# Patient Record
Sex: Female | Born: 1955 | Race: White | Hispanic: No | Marital: Married | Smoking: Never smoker
Health system: Southern US, Community
[De-identification: ages and names within clinical notes are randomized; demographics above are authoritative.]

## PROBLEM LIST (undated history)

## (undated) DIAGNOSIS — M8580 Other specified disorders of bone density and structure, unspecified site: Secondary | ICD-10-CM

## (undated) DIAGNOSIS — I499 Cardiac arrhythmia, unspecified: Secondary | ICD-10-CM

## (undated) DIAGNOSIS — C44310 Basal cell carcinoma of skin of unspecified parts of face: Secondary | ICD-10-CM

## (undated) DIAGNOSIS — C4490 Unspecified malignant neoplasm of skin, unspecified: Secondary | ICD-10-CM

## (undated) HISTORY — DX: Unspecified malignant neoplasm of skin, unspecified: C44.90

## (undated) HISTORY — DX: Other specified disorders of bone density and structure, unspecified site: M85.80

## (undated) HISTORY — DX: Basal cell carcinoma of skin of unspecified parts of face: C44.310

## (undated) HISTORY — DX: Cardiac arrhythmia, unspecified: I49.9

## (undated) HISTORY — PX: APPENDECTOMY: SHX54

---

## 2010-06-28 DIAGNOSIS — Z85828 Personal history of other malignant neoplasm of skin: Secondary | ICD-10-CM | POA: Insufficient documentation

## 2015-11-23 ENCOUNTER — Other Ambulatory Visit: Payer: Self-pay | Admitting: Family Medicine

## 2015-11-23 DIAGNOSIS — E2839 Other primary ovarian failure: Secondary | ICD-10-CM

## 2015-11-23 DIAGNOSIS — Z1231 Encounter for screening mammogram for malignant neoplasm of breast: Secondary | ICD-10-CM

## 2016-01-26 ENCOUNTER — Encounter: Payer: 59 | Admitting: Obstetrics & Gynecology

## 2016-02-16 ENCOUNTER — Ambulatory Visit (INDEPENDENT_AMBULATORY_CARE_PROVIDER_SITE_OTHER): Payer: 59 | Admitting: Obstetrics & Gynecology

## 2016-02-16 ENCOUNTER — Encounter: Payer: Self-pay | Admitting: Obstetrics & Gynecology

## 2016-02-16 VITALS — BP 118/72 | HR 96 | Resp 14 | Ht 66.5 in | Wt 140.8 lb

## 2016-02-16 DIAGNOSIS — Z0000 Encounter for general adult medical examination without abnormal findings: Secondary | ICD-10-CM

## 2016-02-16 DIAGNOSIS — Z1211 Encounter for screening for malignant neoplasm of colon: Secondary | ICD-10-CM

## 2016-02-16 DIAGNOSIS — Z205 Contact with and (suspected) exposure to viral hepatitis: Secondary | ICD-10-CM | POA: Diagnosis not present

## 2016-02-16 DIAGNOSIS — Z124 Encounter for screening for malignant neoplasm of cervix: Secondary | ICD-10-CM

## 2016-02-16 DIAGNOSIS — Z01419 Encounter for gynecological examination (general) (routine) without abnormal findings: Secondary | ICD-10-CM | POA: Diagnosis not present

## 2016-02-16 LAB — POCT URINALYSIS DIPSTICK
Bilirubin, UA: NEGATIVE
Glucose, UA: NEGATIVE
KETONES UA: NEGATIVE
Leukocytes, UA: NEGATIVE
Nitrite, UA: NEGATIVE
PROTEIN UA: NEGATIVE
RBC UA: NEGATIVE
UROBILINOGEN UA: NEGATIVE
pH, UA: 5

## 2016-02-16 LAB — HEPATITIS C ANTIBODY: HCV AB: NEGATIVE

## 2016-02-16 NOTE — Patient Instructions (Addendum)
Dermatology:  Marcine Matar  Check about having a 3D mammogram with the breast cancer

## 2016-02-16 NOTE — Progress Notes (Signed)
61 y.o. G1P0100 MarriedCaucasianF here for annual exam.  Moved to [place] from [place] in 2015.  Loves not being the city any longer.    PCP:  Dr. Melford Aase  No LMP recorded. Patient is postmenopausal.          Sexually active: No.  The current method of family planning is post menopausal status.    Exercising: Yes.    semi- regular walking, zumba Smoker:  no  Health Maintenance: Pap:  2015 in [place]  History of abnormal Pap:  no MMG:  2 years ago in [place]- [facility]  Colonoscopy:  Never.  Referal to Dr. Collene Mares made today. BMD:   Scheduled for 03/01/16 at [place]:  unsure Pneumonia vaccine(s):  never Zostavax:   never Hep C testing: will do today Screening Labs: PCP, Hb today: PCP, Urine today: normal    reports that she has never smoked. She has never used smokeless tobacco. She reports that she drinks about 4.2 oz of alcohol per week . She reports that she does not use drugs.  Past Medical History:  Diagnosis Date  . Irregular heartbeat   . Skin cancer    skin cancer on face    Past Surgical History:  Procedure Laterality Date  . APPENDECTOMY     patient states she was 61 years old when removed    Current Outpatient Prescriptions  Medication Sig Dispense Refill  . CALCIUM PO Take 1,000 mg by mouth daily.     . Cholecalciferol (VITAMIN D3 PO) Take 1,000 mg by mouth daily.     . Magnesium 500 MG CAPS Take by mouth daily.      No current facility-administered medications for this visit.     Family History  Problem Relation Age of Onset  . Prostate cancer Father   . Breast cancer Paternal Aunt   . Ovarian cancer Paternal Aunt     ROS:  Pertinent items are noted in HPI.  Otherwise, a comprehensive ROS was negative.  Exam:   BP 118/72 (BP Location: Right Arm, Patient Position: Sitting, Cuff Size: Normal)   Pulse 96   Resp 14   Ht 5' 6.5" (1.689 m)   Wt 140 lb 12.8 oz (63.9 kg)   BMI 22.39 kg/m    Height: 5' 6.5" (168.9 cm)   Ht Readings from Last 3 Encounters:  02/16/16 5' 6.5" (1.689 m)    General appearance: alert, cooperative and appears stated age Head: Normocephalic, without obvious abnormality, atraumatic Neck: no adenopathy, supple, symmetrical, trachea midline and thyroid normal to inspection and palpation Lungs: clear to auscultation bilaterally Breasts: normal appearance, no masses or tenderness Heart: regular rate and rhythm Abdomen: soft, non-tender; bowel sounds normal; no masses,  no organomegaly Extremities: extremities normal, atraumatic, no cyanosis or edema Skin: Skin color, texture, turgor normal. No rashes or lesions Lymph nodes: Cervical, supraclavicular, and axillary nodes normal. No abnormal inguinal nodes palpated Neurologic: Grossly normal   Pelvic: External genitalia:  no lesions              Urethra:  normal appearing urethra with no masses, tenderness or lesions              Bartholins and Skenes: normal                 Vagina: normal appearing vagina with normal color and discharge, no lesions              Cervix: no lesions  Pap taken: Yes.   Bimanual Exam:  Uterus:  normal size, contour, position, consistency, mobility, non-tender              Adnexa: normal adnexa and no mass, fullness, tenderness               Rectovaginal: Confirms               Anus:  normal sphincter tone, no lesions  Chaperone was present for exam.  A:  Well Woman with normal exam PMP, no HRT Establishing care  P:   Mammogram guidelines reviewed.  Pt is going to have 3D imaging BMD is scheduled Colonoscopy referral made Pap and HR HPV Hep c antibody obtained today return annually or prn

## 2016-02-21 LAB — IPS PAP TEST WITH HPV

## 2016-03-01 ENCOUNTER — Ambulatory Visit
Admission: RE | Admit: 2016-03-01 | Discharge: 2016-03-01 | Disposition: A | Discharge status: Home / Self Care | Payer: 59 | Source: Ambulatory Visit | Attending: Family Medicine | Admitting: Family Medicine

## 2016-03-01 DIAGNOSIS — Z1231 Encounter for screening mammogram for malignant neoplasm of breast: Secondary | ICD-10-CM

## 2016-03-01 DIAGNOSIS — E2839 Other primary ovarian failure: Secondary | ICD-10-CM

## 2016-07-24 ENCOUNTER — Encounter: Payer: Self-pay | Admitting: Obstetrics & Gynecology

## 2016-12-08 DIAGNOSIS — C44310 Basal cell carcinoma of skin of unspecified parts of face: Secondary | ICD-10-CM

## 2016-12-08 HISTORY — DX: Basal cell carcinoma of skin of unspecified parts of face: C44.310

## 2016-12-08 HISTORY — PX: MOHS SURGERY: SUR867

## 2017-01-17 ENCOUNTER — Other Ambulatory Visit: Payer: Self-pay | Admitting: Family Medicine

## 2017-01-17 DIAGNOSIS — Z1231 Encounter for screening mammogram for malignant neoplasm of breast: Secondary | ICD-10-CM

## 2017-03-04 ENCOUNTER — Ambulatory Visit
Admission: RE | Admit: 2017-03-04 | Discharge: 2017-03-04 | Disposition: A | Discharge status: Home / Self Care | Payer: 59 | Source: Ambulatory Visit | Attending: Family Medicine | Admitting: Family Medicine

## 2017-03-04 DIAGNOSIS — Z1231 Encounter for screening mammogram for malignant neoplasm of breast: Secondary | ICD-10-CM

## 2017-03-15 ENCOUNTER — Ambulatory Visit (INDEPENDENT_AMBULATORY_CARE_PROVIDER_SITE_OTHER): Payer: 59 | Admitting: Obstetrics & Gynecology

## 2017-03-15 ENCOUNTER — Encounter: Payer: Self-pay | Admitting: Obstetrics & Gynecology

## 2017-03-15 ENCOUNTER — Other Ambulatory Visit: Payer: Self-pay

## 2017-03-15 VITALS — BP 126/60 | HR 84 | Resp 16 | Ht 66.5 in | Wt 141.0 lb

## 2017-03-15 DIAGNOSIS — Z01419 Encounter for gynecological examination (general) (routine) without abnormal findings: Secondary | ICD-10-CM | POA: Diagnosis not present

## 2017-03-15 NOTE — Progress Notes (Signed)
62 y.o. G1P0100 MarriedCaucasianF here for annual exam.  Doing well.  Denies vaginal bleeding.    Had two other [place] so had Mohs Surgery this past year.  Had blue light therapy in January.  Being seen every six months.      PCP:  Dr. Melford Aase  No LMP recorded. Patient is postmenopausal.          Sexually active: Yes.    The current method of family planning is post menopausal status.    Exercising: Yes.    walking Smoker:  no  Health Maintenance: Pap:  02/16/16 Neg. HR HPV:neg  History of abnormal Pap:  no MMG:  03/04/17 BIRADS1:neg  Colonoscopy:  05/09/16 f/u 10 years  BMD:   03/01/16 Osteopenia  TDaP:  2018 Pneumonia vaccine(s):  No Shingrix:   No Hep C testing: 02/16/16 Neg  Screening Labs: PCP   reports that  has never smoked. she has never used smokeless tobacco. She reports that she drinks about 4.2 oz of alcohol per week. She reports that she does not use drugs.  Past Medical History:  Diagnosis Date  . Basal cell carcinoma (BCC) of skin of face 12/2016  . Irregular heartbeat   . Osteopenia   . Skin cancer    skin cancer on face    Past Surgical History:  Procedure Laterality Date  . APPENDECTOMY     patient states she was 62 years old when removed  . SKIN CANCER EXCISION  12/2016   face    Current Outpatient Medications  Medication Sig Dispense Refill  . CALCIUM PO Take 1,000 mg by mouth daily.     . Cholecalciferol (VITAMIN D3 PO) Take 1,000 mg by mouth daily.     . Magnesium 500 MG CAPS Take by mouth daily.      No current facility-administered medications for this visit.     Family History  Problem Relation Age of Onset  . Prostate cancer Father   . Breast cancer Paternal Aunt   . Ovarian cancer Paternal Aunt     ROS:  Pertinent items are noted in HPI.  Otherwise, a comprehensive ROS was negative.  Exam:   BP 126/60 (BP Location: Right Arm, Patient Position: Sitting, Cuff Size: Normal)   Pulse 84   Resp 16   Ht 5' 6.5" (1.689 m)   Wt 141 lb (64 kg)    BMI 22.42 kg/m     Height: 5' 6.5" (168.9 cm)  Ht Readings from Last 3 Encounters:  03/15/17 5' 6.5" (1.689 m)  02/16/16 5' 6.5" (1.689 m)    General appearance: alert, cooperative and appears stated age Head: Normocephalic, without obvious abnormality, atraumatic Neck: no adenopathy, supple, symmetrical, trachea midline and thyroid normal to inspection and palpation Lungs: clear to auscultation bilaterally Breasts: normal appearance, no masses or tenderness Heart: regular rate and rhythm Abdomen: soft, non-tender; bowel sounds normal; no masses,  no organomegaly Extremities: extremities normal, atraumatic, no cyanosis or edema Skin: Skin color, texture, turgor normal. No rashes or lesions Lymph nodes: Cervical, supraclavicular, and axillary nodes normal. No abnormal inguinal nodes palpated Neurologic: Grossly normal   Pelvic: External genitalia:  no lesions              Urethra:  normal appearing urethra with no masses, tenderness or lesions              Bartholins and Skenes: normal                 Vagina: normal  appearing vagina with normal color and discharge, no lesions              Cervix: no lesions              Pap taken: No. Bimanual Exam:  Uterus:  normal size, contour, position, consistency, mobility, non-tender              Adnexa: normal adnexa and no mass, fullness, tenderness               Rectovaginal: Confirms               Anus:  normal sphincter tone, no lesions  Chaperone was present for exam.  A:  Well Woman with normal exam PMP, no HRT H/O BCC with multiple Mohs surgeries  P:   Mammogram guidelines reviewed.  Have dicussed 3D imaging. pap smear not indicated this year.  Neg pap and new HR HPV 2/18 Colonoscopy and BMD are UTD D/w pt Shingrix vaccination.  Not really interested in this. Blood work is UTD Return annually or prn

## 2019-09-09 IMAGING — MG DIGITAL SCREENING BILATERAL MAMMOGRAM WITH TOMO AND CAD
8 series · 9 of 24 positions shown · non-contrast
Comparison: Previous exam(s).

CLINICAL DATA: Screening.

EXAM:
DIGITAL SCREENING BILATERAL MAMMOGRAM WITH TOMO AND CAD

[L CC synth-2D]
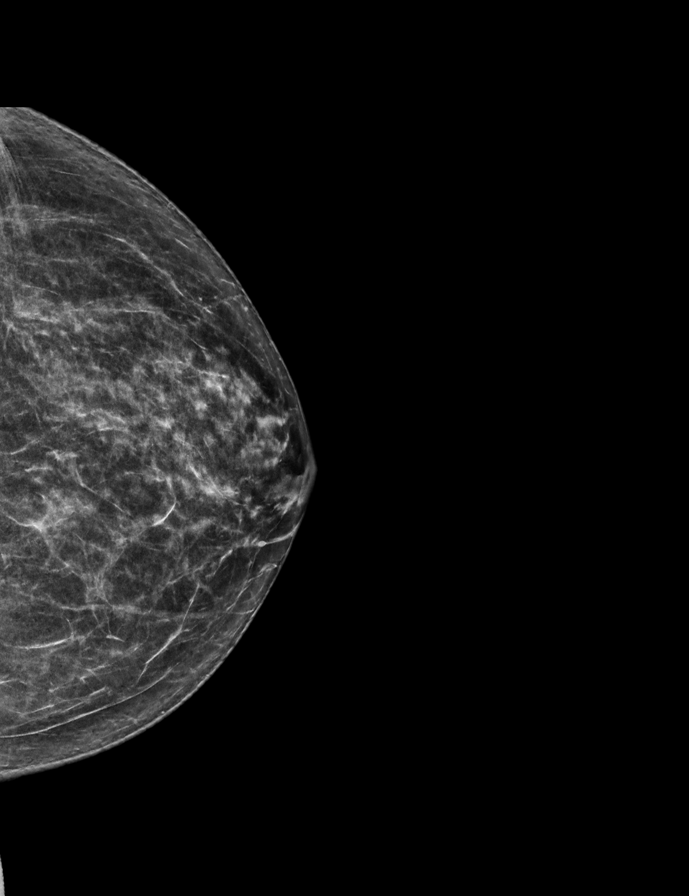

[L MLO synth-2D]
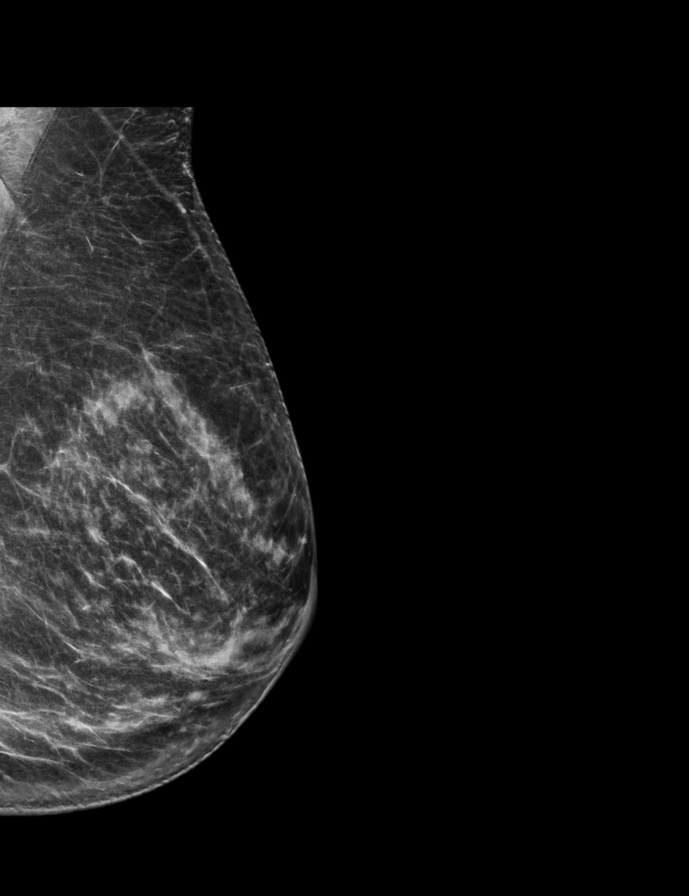

[R MLO synth-2D]
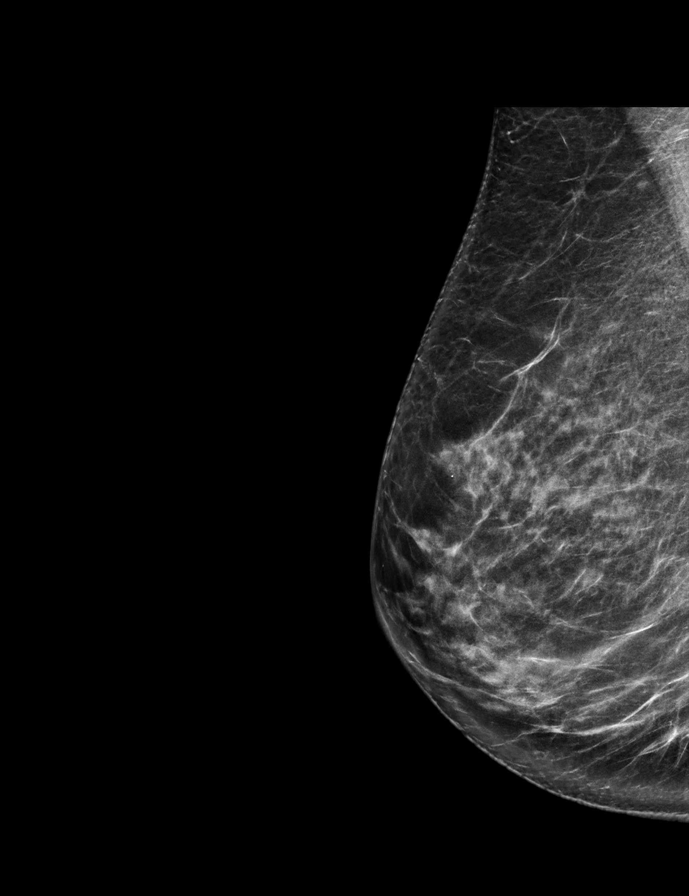

[R CC synth-2D]
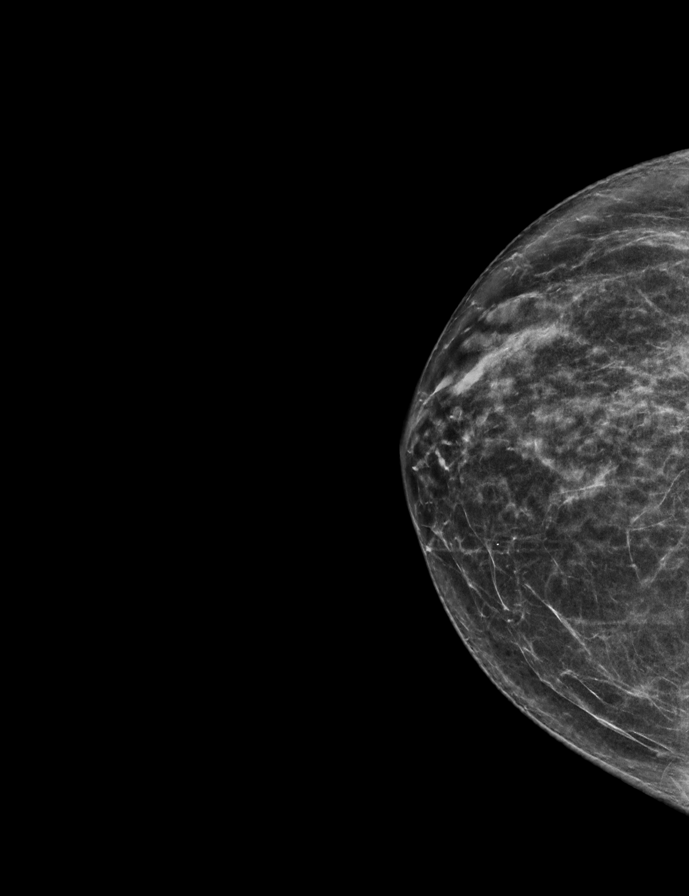

[L CC tomo · 2 of 63 frames shown]
[frame 21/63]
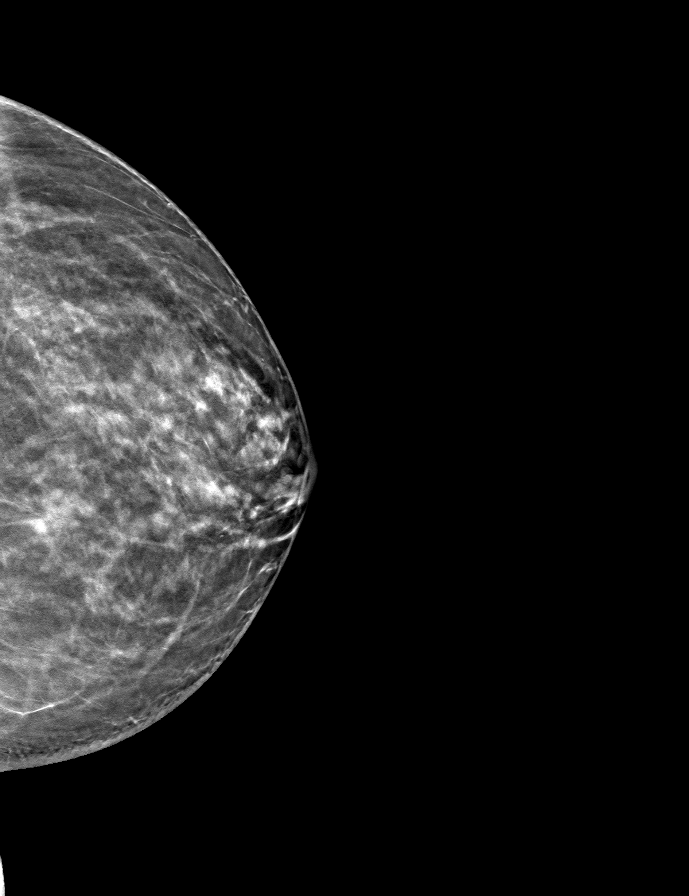
[frame 32/63]
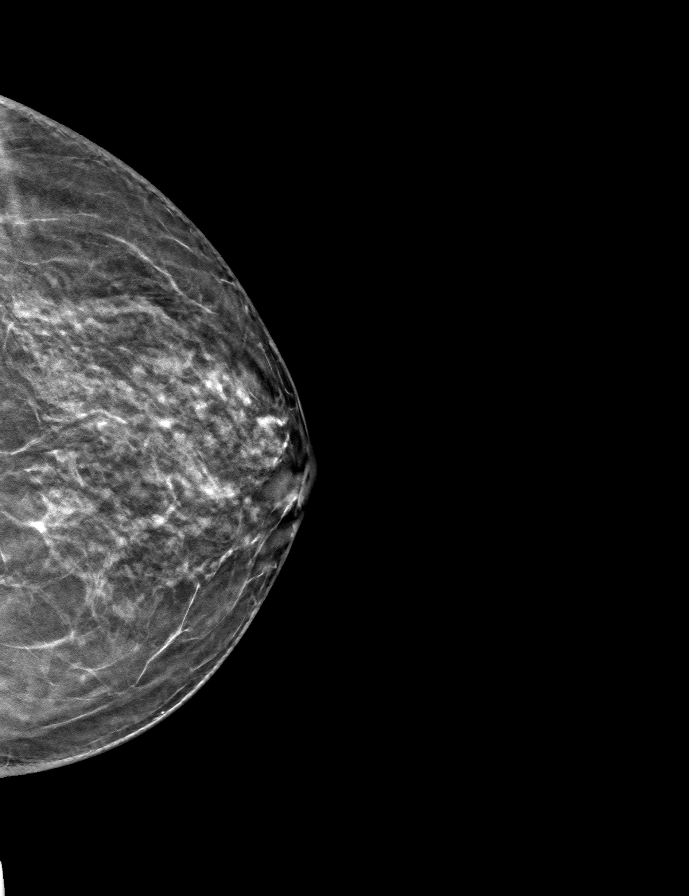

[L MLO tomo · tomo slice 35/68.0]
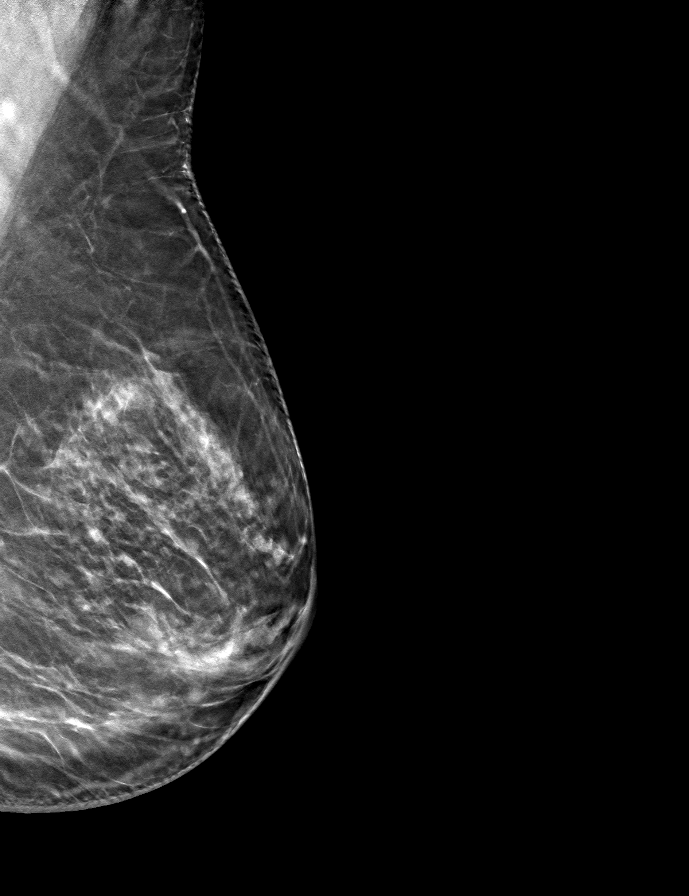

[R MLO tomo · tomo slice 37/72.0]
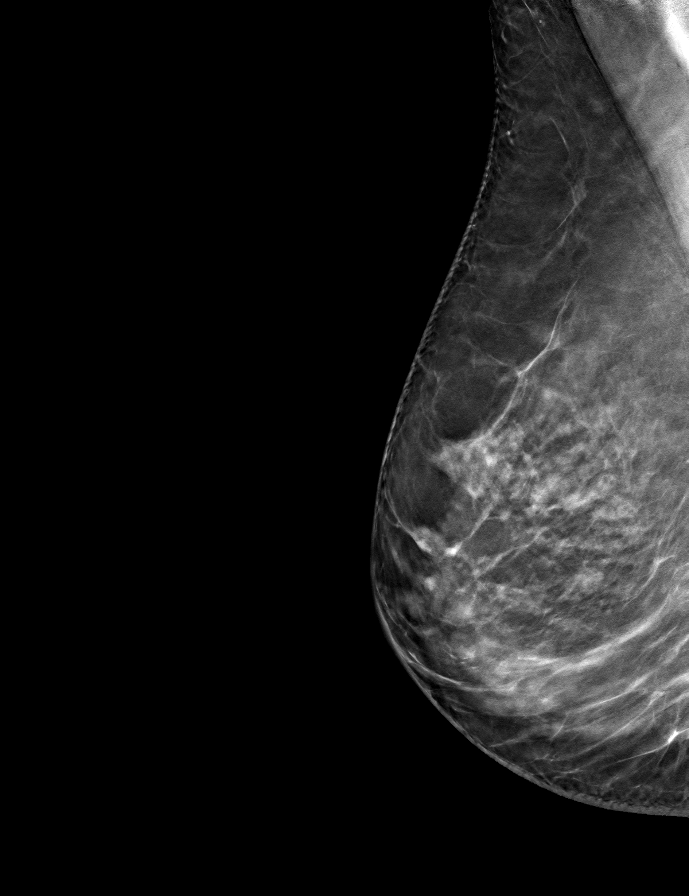

[R CC tomo · tomo slice 33/66.0]
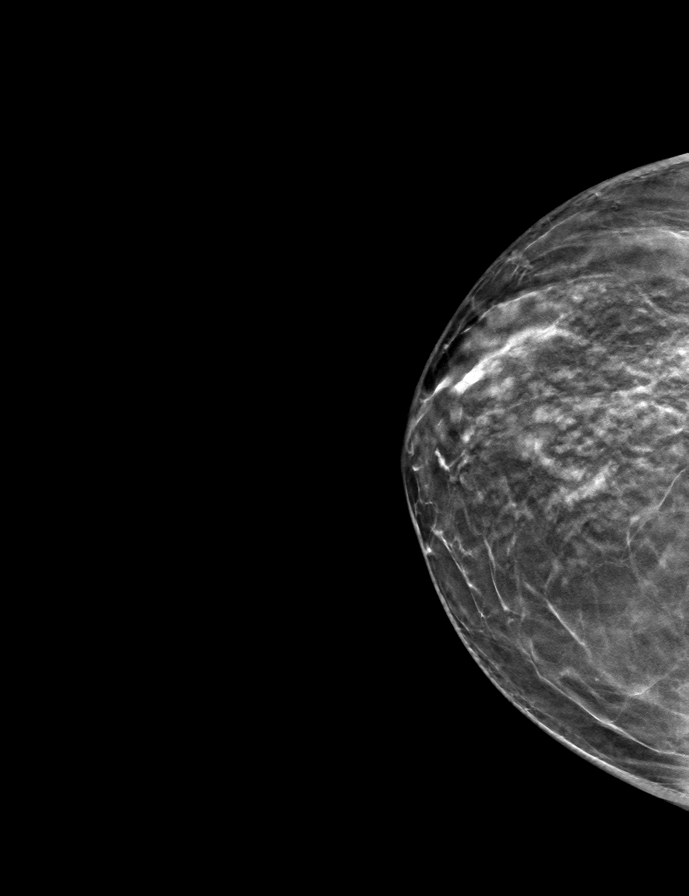

[9 of 24 positions shown; findings below may reference images not displayed]

ACR Breast Density Category b: There are scattered areas of
fibroglandular density.
FINDINGS: There are no findings suspicious for malignancy. Images were
processed with CAD.
IMPRESSION: No mammographic evidence of malignancy. A result letter of this
screening mammogram will be mailed directly to the patient.

RECOMMENDATION:
Screening mammogram in one year. (Code:[id number])

BI-RADS CATEGORY  1: Negative.
